# Patient Record
Sex: Male | Born: 1970 | Race: White | Hispanic: No | Marital: Single | Smoking: Never smoker
Health system: Southern US, Community
[De-identification: ages and names within clinical notes are randomized; demographics above are authoritative.]

## PROBLEM LIST (undated history)

## (undated) HISTORY — PX: APPENDECTOMY: SHX54

---

## 2004-10-18 ENCOUNTER — Emergency Department: Payer: Self-pay | Admitting: Emergency Medicine

## 2005-10-29 ENCOUNTER — Emergency Department: Payer: Self-pay | Admitting: Emergency Medicine

## 2005-12-29 ENCOUNTER — Ambulatory Visit: Payer: Self-pay

## 2006-06-06 IMAGING — CR DG CHEST 2V
1 series · 2 of 2 positions shown · non-contrast
Comparison: none

REASON FOR EXAM: Pain
COMMENTS:

PROCEDURE:     DXR - DXR CHEST PA (OR AP) AND LATERAL  - October 29, 2005 [DATE]
RESULT:          Two views of the chest show the lungs are clear.  The heart
and pulmonary vessels are normal.  The bony and mediastinal structures are
unremarkable.

[Series 2097: postero_anterior · 0.11mm/px · 2 of 2 slices shown]
[im 1/2]
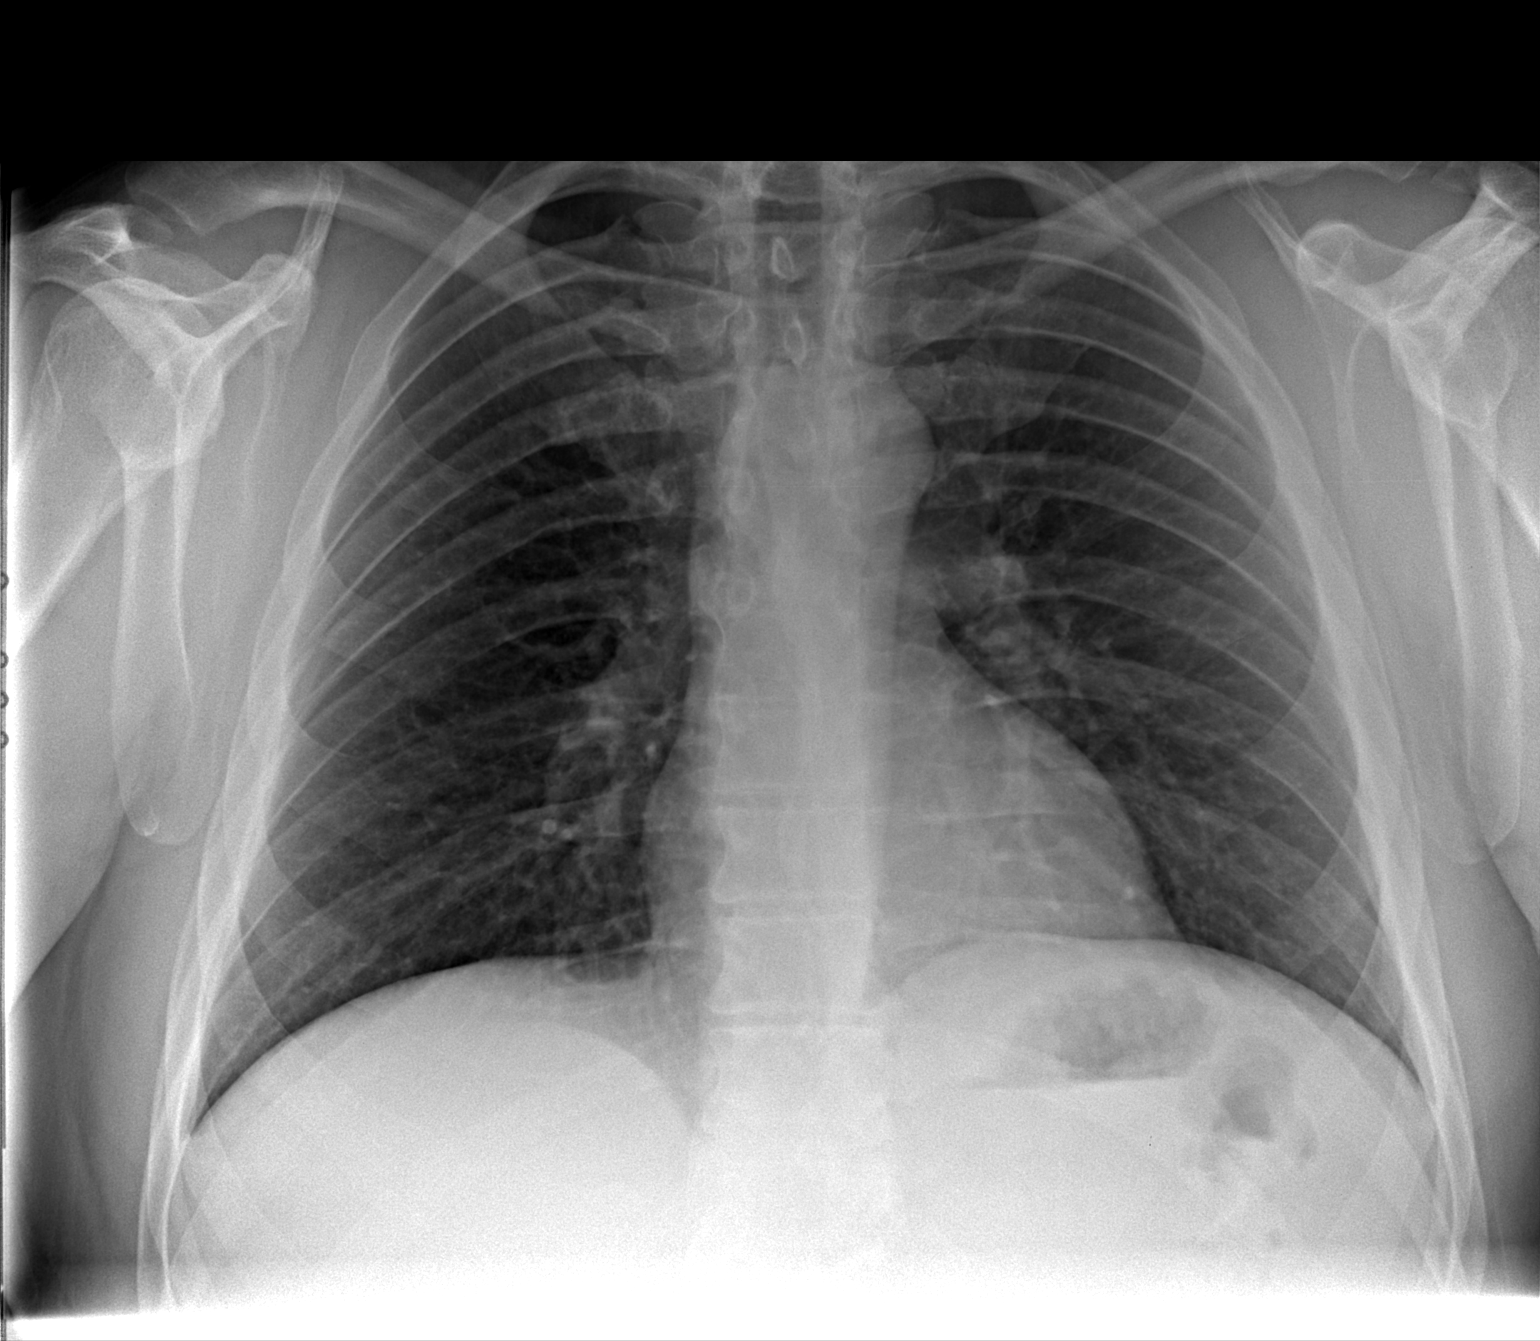
[im 2/2]
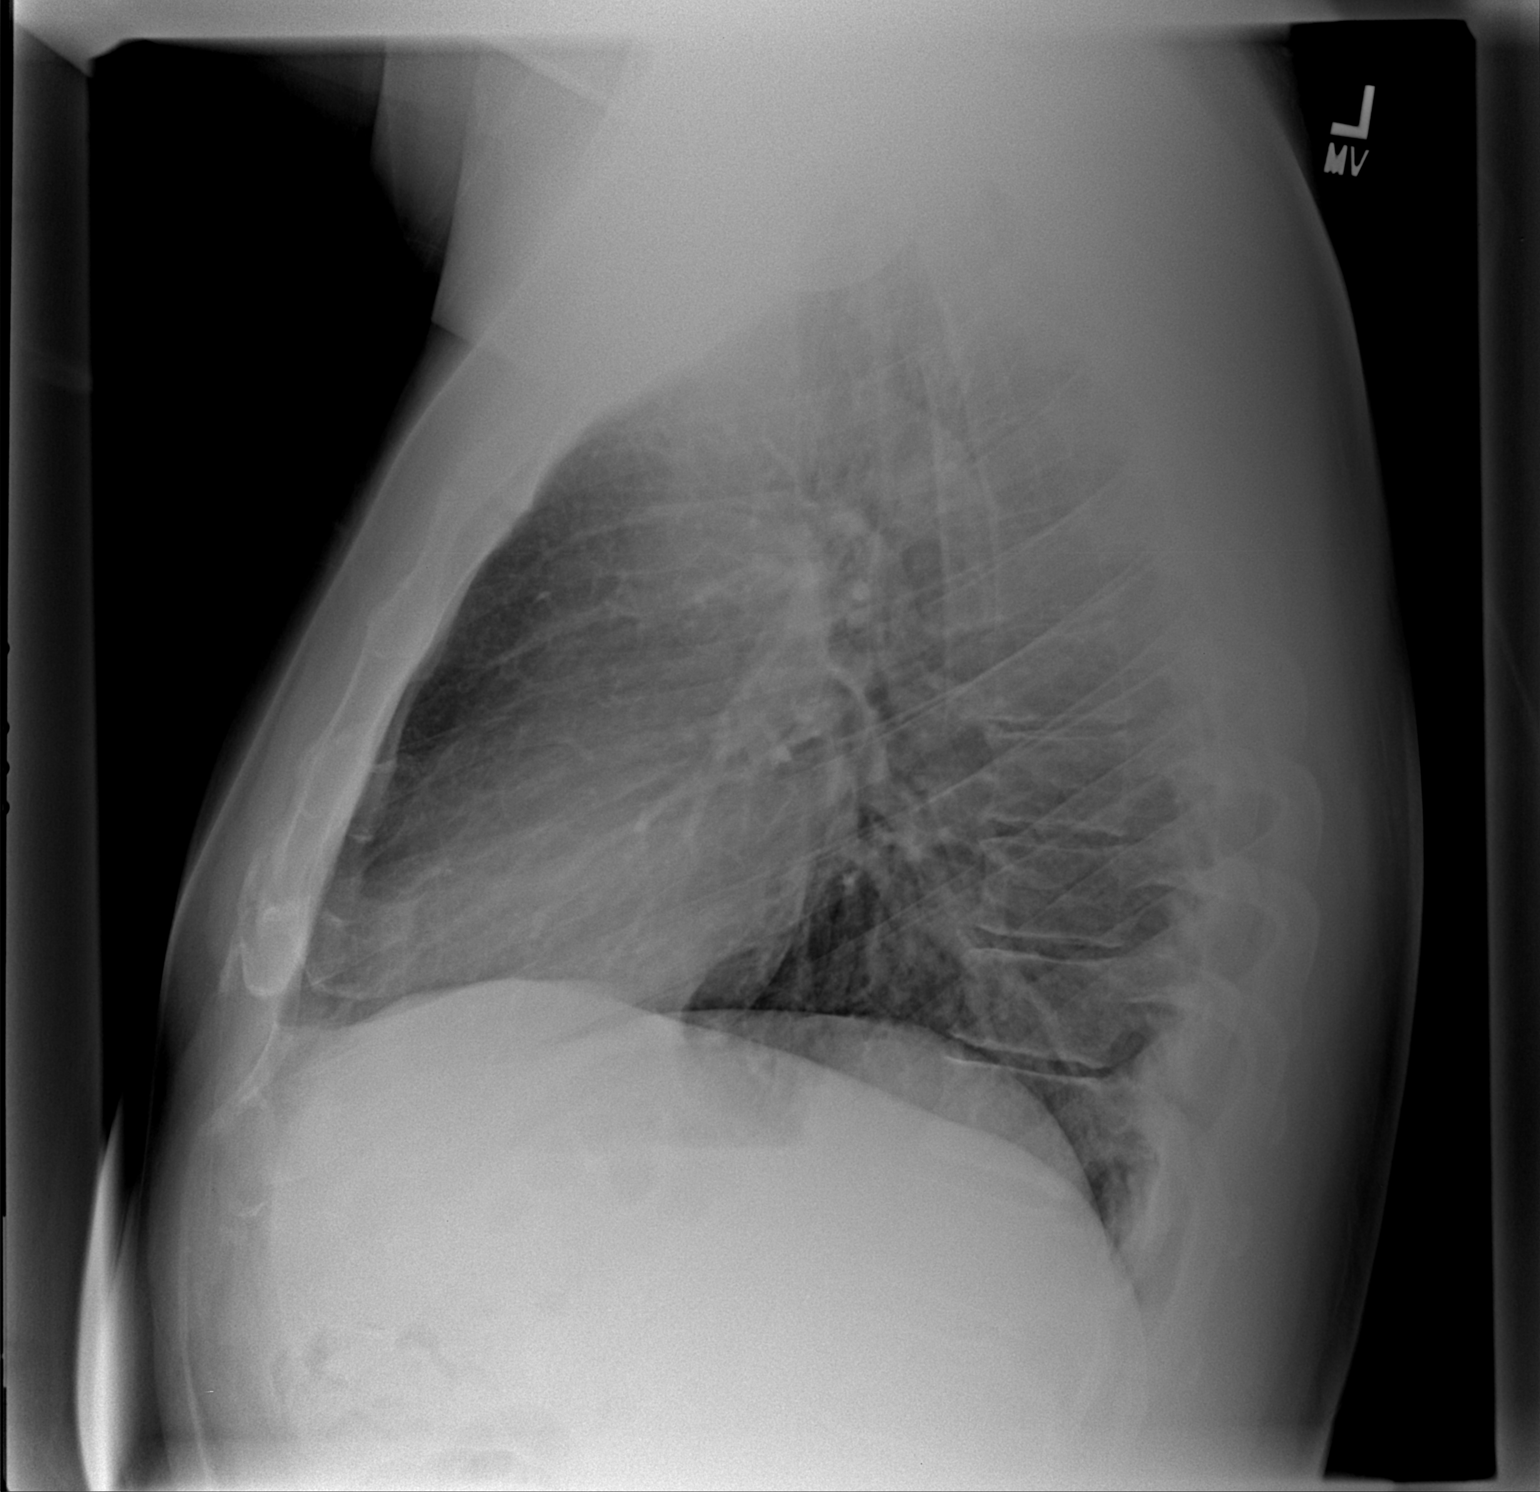

[2 of 2 positions shown; findings below may reference images not displayed]

IMPRESSION: No acute cardiopulmonary disease is evident.

## 2006-06-06 IMAGING — CR RIGHT RING FINGER 2+V
1 series · 3 of 3 positions shown · non-contrast
Comparison: none

REASON FOR EXAM: Injury
COMMENTS:

[Series 2096: postero_anterior · 0.11mm/px · 3 of 3 slices shown]
[im 1/3]
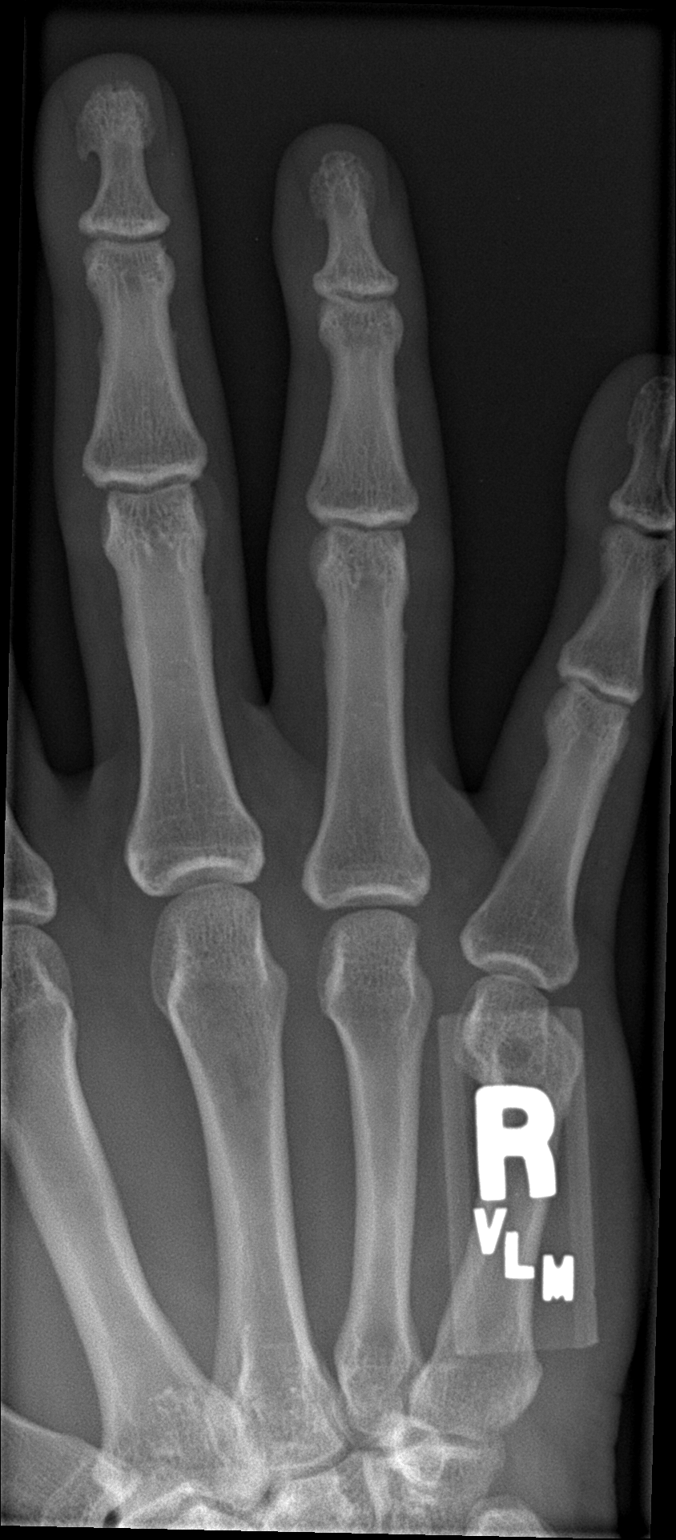
[im 2/3]
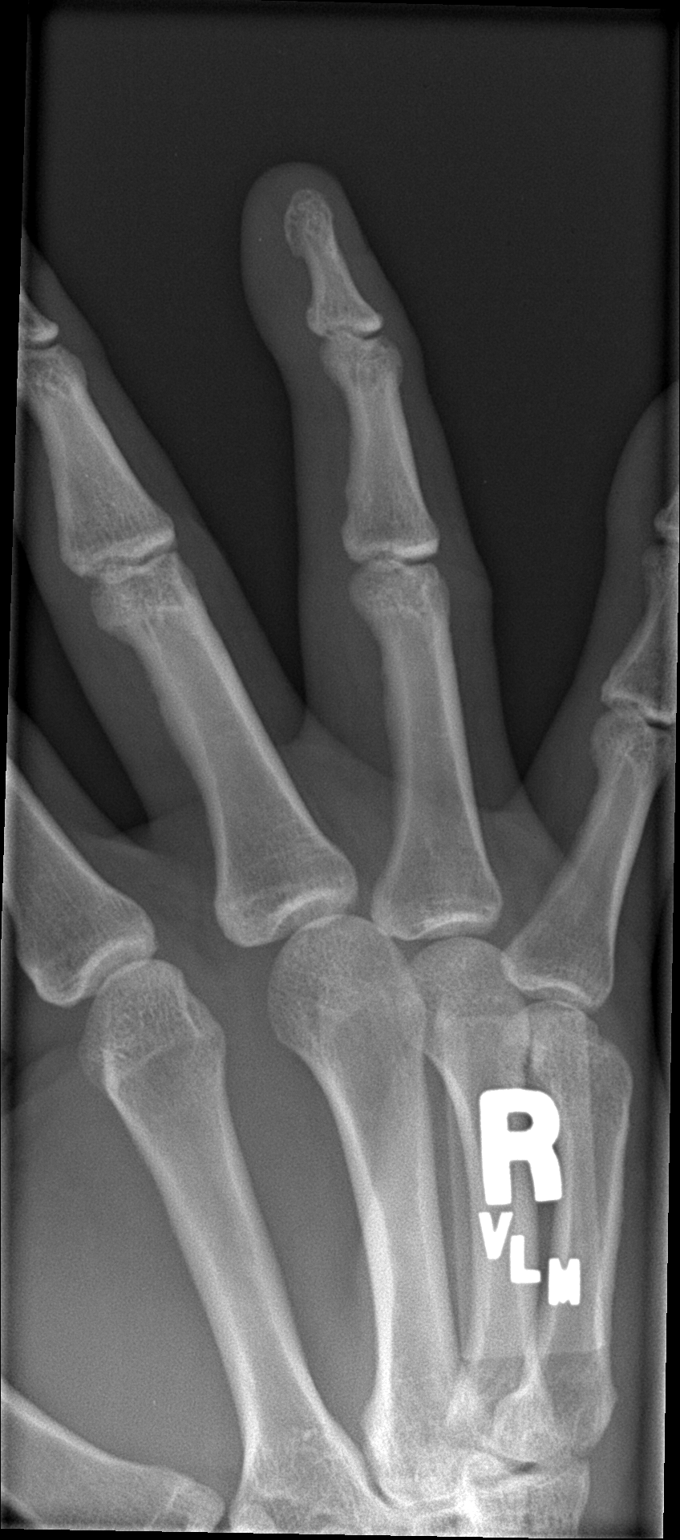
[im 3/3]
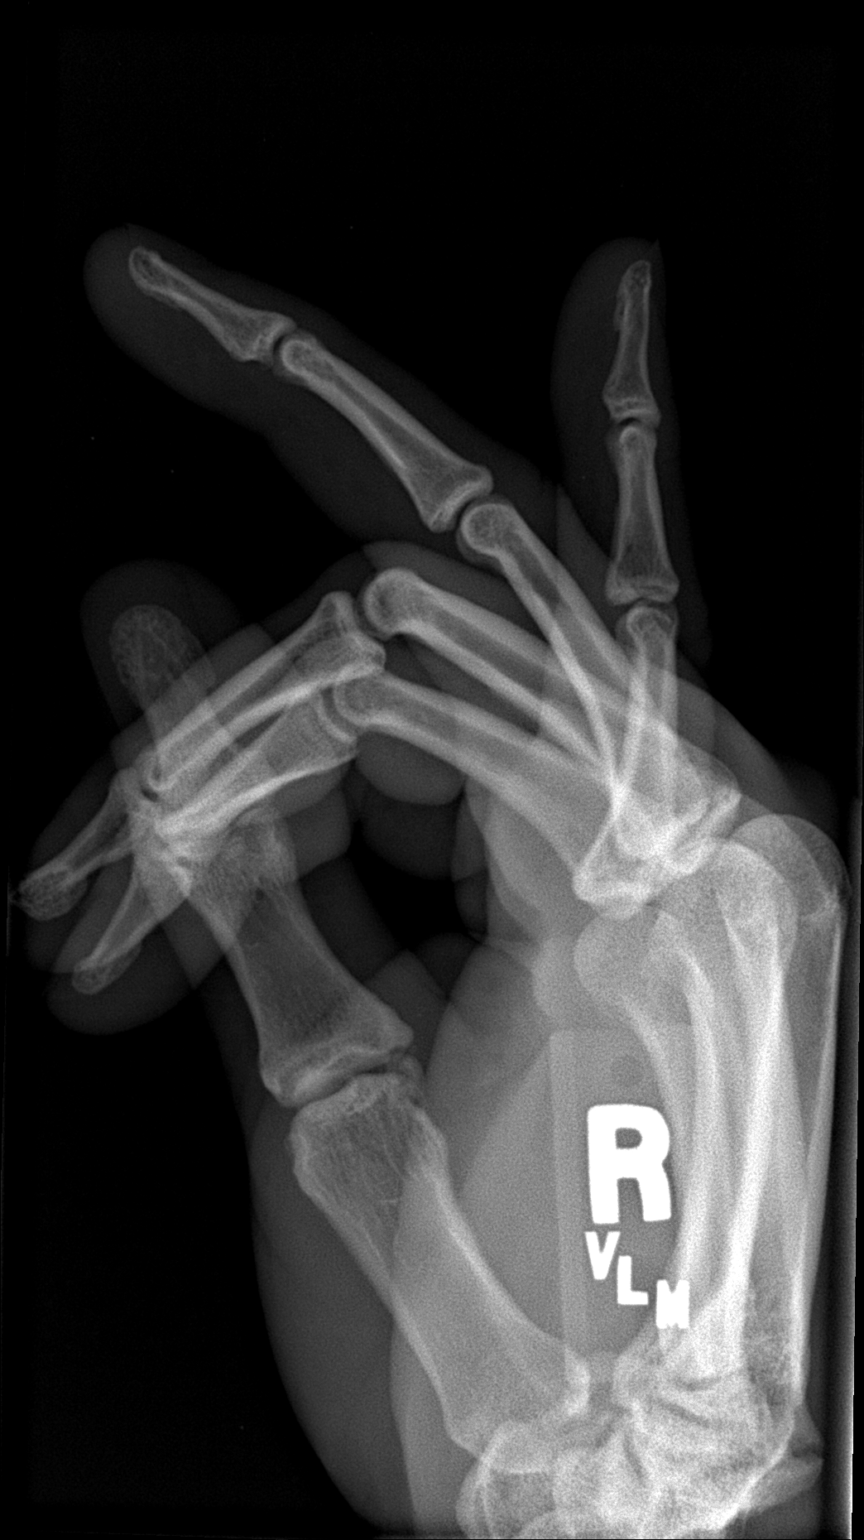

[3 of 3 positions shown; findings below may reference images not displayed]

PROCEDURE:     DXR - DXR FINGER RING 4TH DIGIT RT [place]  - October 29, 2005 [DATE]

RESULT:          Multiple views of the RIGHT fourth finger show soft tissue
swelling, especially about the proximal interphalangeal joint region.  There
is very subtle lucency in the proximal portion of the middle phalanx which
may represent an incomplete or nondisplaced fracture.  This appears to
extend to the articular surface.  No other definite abnormality is seen.
IMPRESSION: Fracture involving the proximal portion of the middle
phalanx without displacement or comminution.

## 2021-04-21 ENCOUNTER — Ambulatory Visit: Payer: Self-pay | Admitting: Physician Assistant

## 2021-04-21 ENCOUNTER — Encounter: Payer: Self-pay | Admitting: Physician Assistant

## 2021-04-21 ENCOUNTER — Other Ambulatory Visit: Payer: Self-pay

## 2021-04-21 DIAGNOSIS — Z113 Encounter for screening for infections with a predominantly sexual mode of transmission: Secondary | ICD-10-CM

## 2021-04-21 DIAGNOSIS — Z299 Encounter for prophylactic measures, unspecified: Secondary | ICD-10-CM

## 2021-04-21 LAB — GRAM STAIN

## 2021-04-21 MED ORDER — DOXYCYCLINE HYCLATE 100 MG PO TABS: 100.0000 mg | ORAL_TABLET | Freq: Two times a day (BID) | ORAL | 0 refills | Status: AC

## 2021-04-21 NOTE — Progress Notes (Signed)
[facility] Department STI clinic/screening visit  Subjective:  Michael Lara is a 50 y.o. male being seen today for an STI screening visit. The patient reports they do have symptoms.    Patient has the following medical conditions:  There are no problems to display for this patient.    Chief Complaint  Patient presents with  . SEXUALLY TRANSMITTED DISEASE    screening    HPI  Patient reports that he has had a white/yellow discharge for 5-6 days.  Denies other symptoms, chronic conditions, and regular medicines.  Reports last HIV test was about 2007 and last void prior to sample collection for Gram stain was over 2 hr ago.   See flowsheet for further details and programmatic requirements.    The following portions of the patient's history were reviewed and updated as appropriate: allergies, current medications, past medical history, past social history, past surgical history and problem list.  Objective:  There were no vitals filed for this visit.  Physical Exam Constitutional:      General: He is not in acute distress.    Appearance: Normal appearance.  HENT:     Head: Normocephalic and atraumatic.     Comments: No nits,lice, or hair loss. No cervical, supraclavicular or axillary adenopathy.    Mouth/Throat:     Mouth: Mucous membranes are moist.     Pharynx: Oropharynx is clear. No oropharyngeal exudate or posterior oropharyngeal erythema.  Eyes:     Conjunctiva/sclera: Conjunctivae normal.  Pulmonary:     Effort: Pulmonary effort is normal.  Abdominal:     Palpations: Abdomen is soft. There is no mass.     Tenderness: There is no abdominal tenderness. There is no guarding or rebound.  Genitourinary:    Penis: Normal.      Testes: Normal.     Comments: Pubic area without nits, lice, hair loss, edema, erythema, lesions and inguinal adenopathy. Penis uncircumcised without rash, lesions and discharge at meatus.  Meatus with mild edema and  erythema. Testicles descended bilaterally,nt, no masses or edema. Musculoskeletal:     Cervical back: Neck supple. No tenderness.  Skin:    General: Skin is warm and dry.     Findings: No bruising, erythema, lesion or rash.  Neurological:     Mental Status: He is alert and oriented to person, place, and time.  Psychiatric:        Mood and Affect: Mood normal.        Behavior: Behavior normal.        Thought Content: Thought content normal.        Judgment: Judgment normal.       Assessment and Plan:  Michael Lara is a 50 y.o. male presenting to the [facility] Department for STI screening  1. Screening for STD (sexually transmitted disease) Patient into clinic with symptoms. Reviewed with patient that Gram stain was normal.  Rec condoms with all sex. Await test results.  Counseled that RN will call if needs to RTC for treatment once results are back. - Gram stain - Gonococcus culture - HIV [facility] - Syphilis Serology, [facility]  2. Prophylactic measure Due to patient risks, symptoms and exam findings with treat with Doxycycline 100 mg #14 1 po BID for 7 days. No sex for 14 days and until after partner is screened/treated. Call with questions or concerns. - doxycycline (VIBRA-TABS) 100 MG tablet; Take 1 tablet (100 mg total) by mouth 2 (two) times  daily for 7 days.  Dispense: 14 tablet; Refill: 0     No follow-ups on file.  No future appointments.  Matt Holmes, PA

## 2021-04-25 LAB — GONOCOCCUS CULTURE

## 2021-04-25 NOTE — Progress Notes (Signed)
Chart reviewed by Pharmacist  Suzanne Walker PharmD, Contract Pharmacist at [facility]  

## 2021-04-26 LAB — HM HIV SCREENING LAB: HM HIV Screening: NEGATIVE
